# Patient Record
Sex: Male | Born: 1985 | Race: Black or African American | Hispanic: No | Marital: Married
Health system: Southern US, Community
[De-identification: ages and names within clinical notes are randomized; demographics above are authoritative.]

---

## 2011-03-27 ENCOUNTER — Emergency Department: Payer: Self-pay | Admitting: Internal Medicine

## 2011-03-27 LAB — URINALYSIS, COMPLETE
Ketone: NEGATIVE
Leukocyte Esterase: NEGATIVE
Nitrite: NEGATIVE
Protein: NEGATIVE
RBC,UR: <1 /HPF (ref 0–5)
Squamous Epithelial: <1

## 2011-03-27 LAB — COMPREHENSIVE METABOLIC PANEL
Albumin: 4.2 g/dL (ref 3.4–5.0)
Alkaline Phosphatase: 78 U/L (ref 50–136)
BUN: 6 mg/dL — ABNORMAL LOW (ref 7–18)
Bilirubin,Total: 0.4 mg/dL (ref 0.2–1.0)
Calcium, Total: 9.1 mg/dL (ref 8.5–10.1)
Creatinine: 0.88 mg/dL (ref 0.60–1.30)
EGFR (African American): >60
Glucose: 97 mg/dL (ref 65–99)
Potassium: 3.5 mmol/L (ref 3.5–5.1)
SGOT(AST): 41 U/L — ABNORMAL HIGH (ref 15–37)
SGPT (ALT): 73 U/L
Total Protein: 7.7 g/dL (ref 6.4–8.2)

## 2011-03-27 LAB — RAPID INFLUENZA A&B ANTIGENS

## 2011-03-27 LAB — CBC
MCV: 89 fL (ref 80–100)
Platelet: 230 10*3/uL (ref 150–440)
RBC: 4.97 10*6/uL (ref 4.40–5.90)
WBC: 8.4 10*3/uL (ref 3.8–10.6)

## 2011-04-07 ENCOUNTER — Ambulatory Visit: Payer: Self-pay | Admitting: Family Medicine

## 2011-04-15 ENCOUNTER — Emergency Department: Payer: Self-pay | Admitting: Emergency Medicine

## 2011-04-15 LAB — CBC WITH DIFFERENTIAL/PLATELET
Eosinophil #: 2.1 10*3/uL — ABNORMAL HIGH (ref 0.0–0.7)
HGB: 13.6 g/dL (ref 13.0–18.0)
Lymphocyte %: 15.2 %
Neutrophil %: 64.1 %
RBC: 4.57 10*6/uL (ref 4.40–5.90)
RDW: 14 % (ref 11.5–14.5)

## 2011-04-15 LAB — COMPREHENSIVE METABOLIC PANEL
Anion Gap: 9 (ref 7–16)
Co2: 26 mmol/L (ref 21–32)
Creatinine: 1.07 mg/dL (ref 0.60–1.30)
EGFR (African American): >60
Glucose: 85 mg/dL (ref 65–99)
Osmolality: 287 (ref 275–301)
Sodium: 144 mmol/L (ref 136–145)

## 2013-02-27 IMAGING — CR DG CHEST 2V
1 series · 3 of 3 positions shown · non-contrast
Comparison: none

REASON FOR EXAM: cough
COMMENTS:

[Series 1: w chest pa · 0.14mm/px · 3 of 3 slices shown]
[im 1/3]
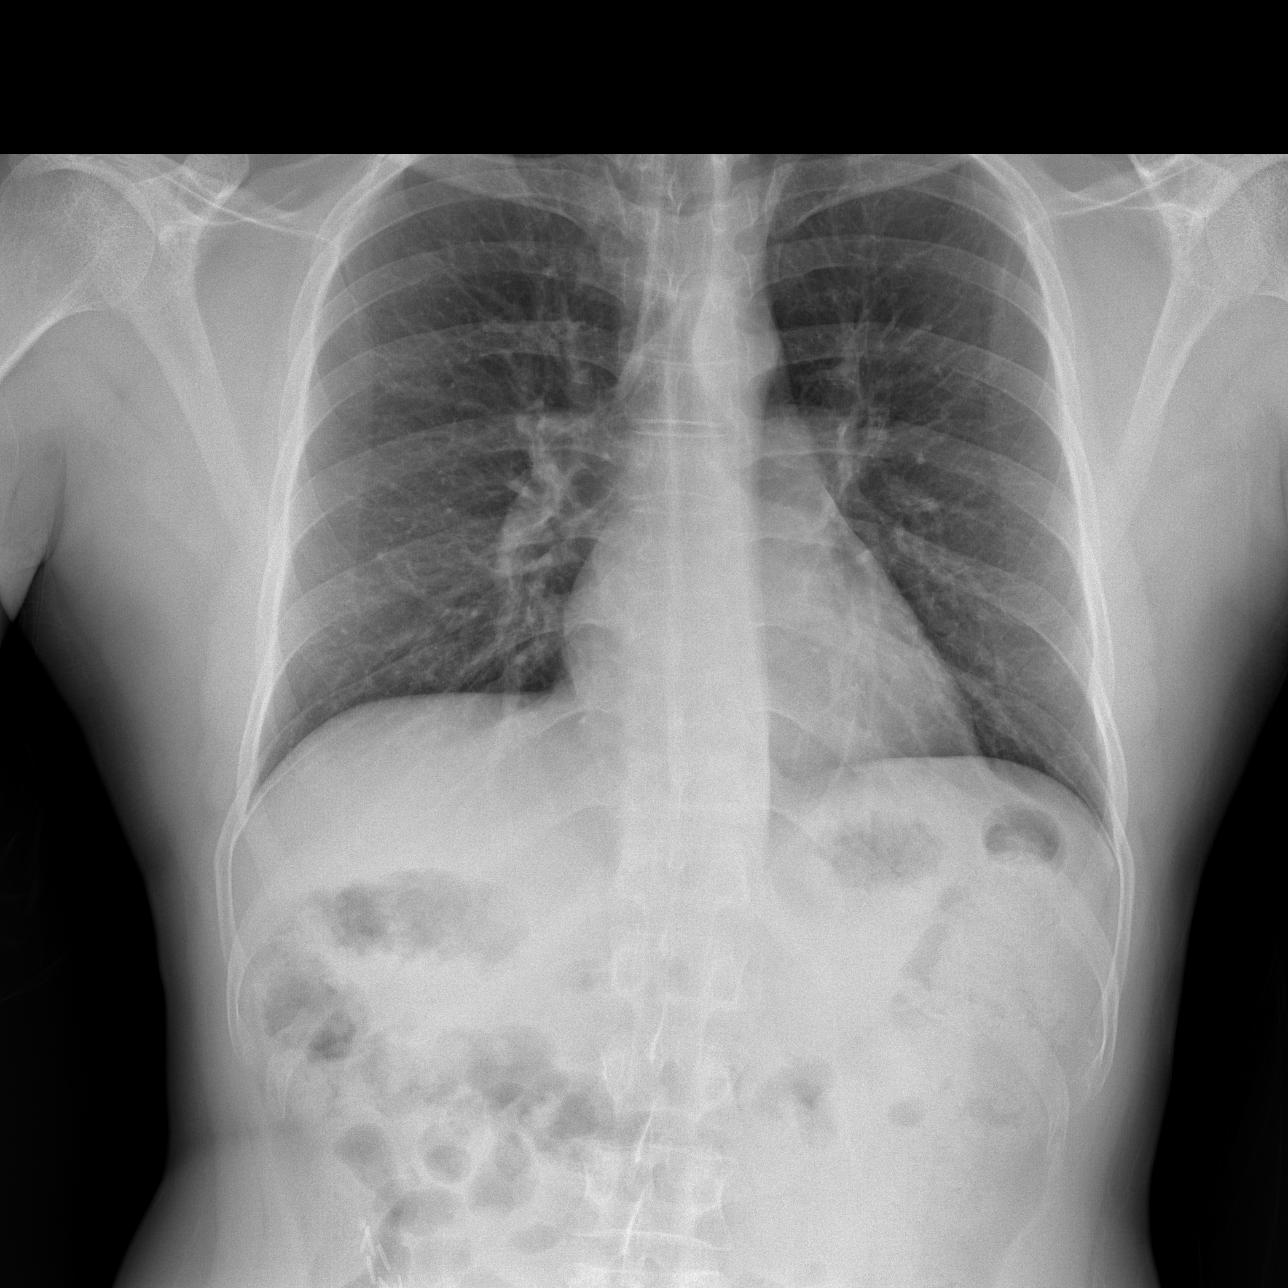
[im 2/3]
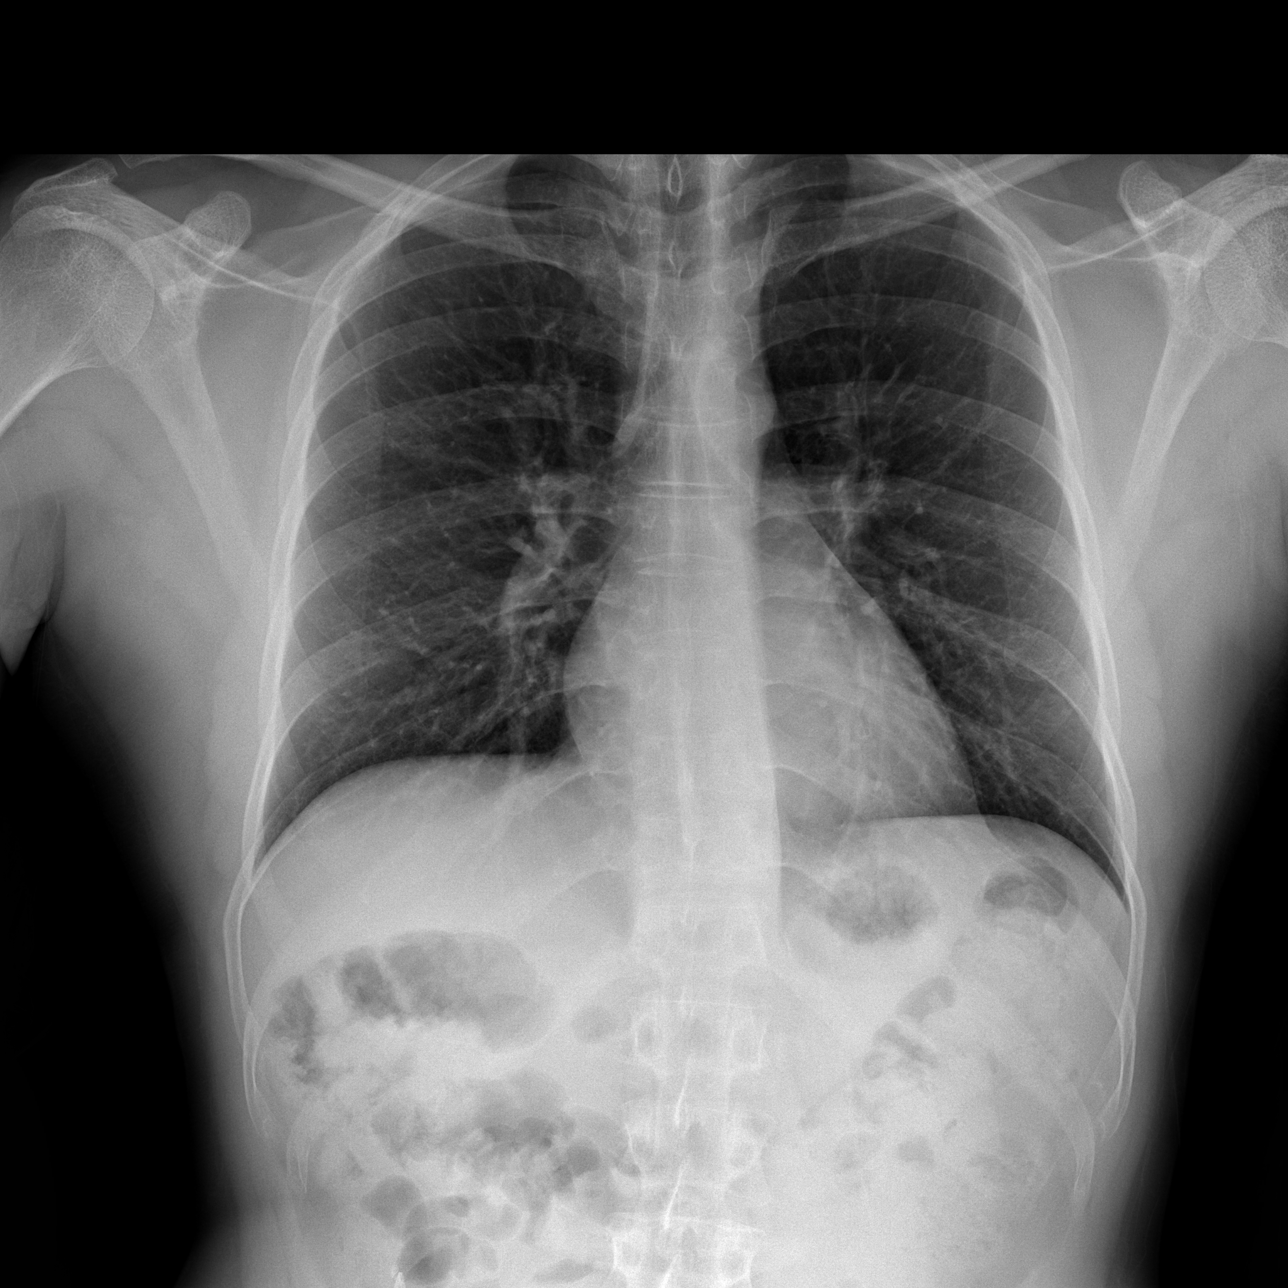
[im 3/3]
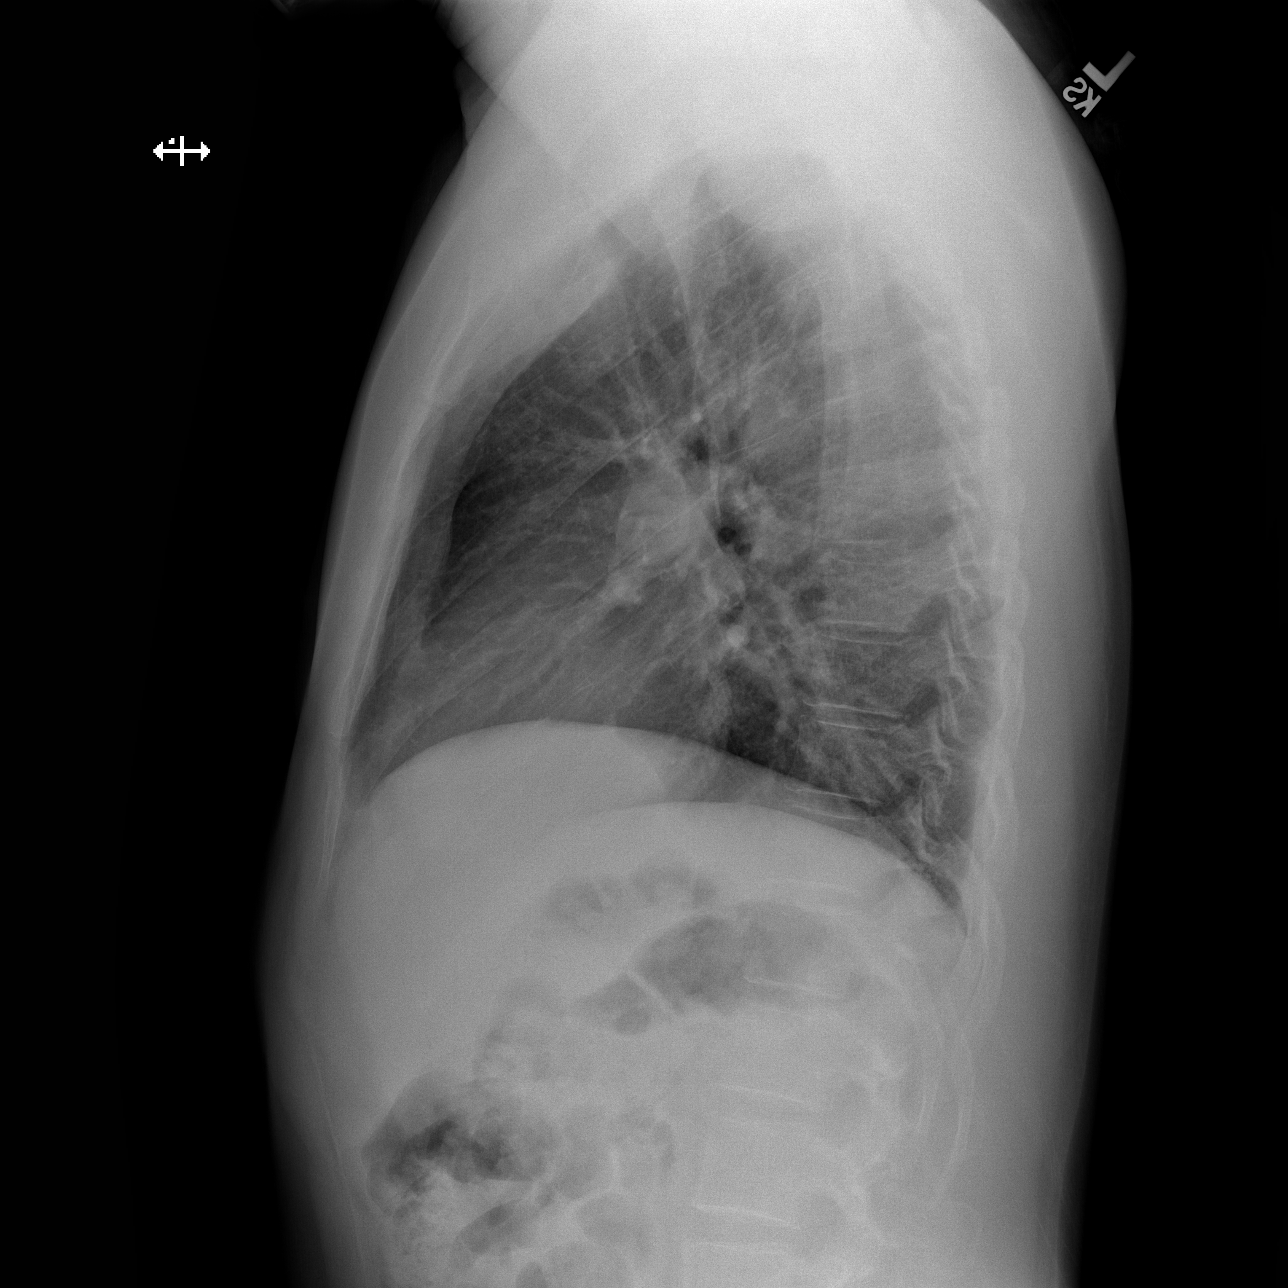

[3 of 3 positions shown; findings below may reference images not displayed]

PROCEDURE:     DXR - DXR CHEST PA (OR AP) AND LATERAL  - March 27, 2011  [DATE]

RESULT:     The lungs are adequately inflated. The perihilar lung markings
are minimally prominent. On the lateral film there are coarse lung markings
projecting over the lower thoracic spine which may reflect atelectasis.
There is no pleural effusion. The cardiac silhouette is normal in size. The
mediastinum is normal in width.
IMPRESSION: 1. I do not see definite evidence of pneumonia but I cannot exclude basilar
atelectasis posteriorly on the left.
2. Mildly increased perihilar lung markings likely reflect subsegmental
atelectasis as can be seen with acute bronchitis.

 Followup films are recommended following anticipated antibiotic therapy to
assure clearing.

## 2014-04-28 NOTE — Consult Note (Signed)
PATIENT NAME:  Ralph Matthews, Ralph Matthews MR#:  [id number] DATE OF BIRTH:  [birth year]  DATE OF CONSULTATION:  04/15/2011  REFERRING PHYSICIAN:  Dr. Manson Passey  CONSULTING PHYSICIAN:  Maricus Tanzi, MD  PRIMARY CARE PHYSICIAN: Venora Maples, Montez Hageman., MD   PRESENTING COMPLAINT: Neck pain and swelling, difficulty swallowing and sore throat.   HISTORY OF PRESENT ILLNESS: Ralph Matthews is a pleasant 29 year old gentleman with history of diagnosed eczema with id reaction approximately eight months ago at [facility], history of gastroesophageal reflux disease, who presents with report of developing neck pain and swelling last night with difficulty swallowing and sore throat and sloughing of his skin. In short, approximately eight months ago when the patient initially had his first rash flare he was seen at [facility] and diagnosed with eczema, unsure if he actually had a biopsy. It sounds like he had cultures of his lesions done but not biopsy. He reports that he was on a high-dose steroid at that time and symptoms did essentially improve. He has been stable since then. Approximately 2 to 3 weeks ago, he was diagnosed with pneumonia outpatient and was given azithromycin and prednisone. At that time, he started developing some significant dryness to his face. By 3 to 4 days ago, he started having rash in his arm and face and neck. In the past, his rash involves from the neck down and his entire body but not his face. He also reports that he was recently diagnosed bronchitis and completed a seven-day course of Augmentin yesterday. He has also been tested for allergies and had had a scratch test done as well without any significant findings except maybe allergy to milk. He denies any fevers, chills, nausea, or vomiting. He still has pain with opening his mouth and still pain in the neck area. He denies any shortness of breath. No palpitations or syncope.   PAST MEDICAL HISTORY:  1. Eczema with id reaction diagnosed eight  months ago at [facility].  2. Gastroesophageal reflux disease.  3. Recently treated for bronchitis with Augmentin, completed yesterday.  4. Pneumonia diagnosed 2 to 3 weeks ago with completion of azithromycin and prednisone.   PAST SURGICAL HISTORY:  1. Appendectomy.  2. Sphincterotomy.  3. The patient reports having had an EGD and colonoscopy to evaluate dysphagia and chronic diarrhea in the past with a history of fissures.   ALLERGIES: Ambien causes rash.   MEDICATIONS: Albuterol inhaler q.i.d. and Tessalon Perles as needed. Again, he completed a seven-day course of Augmentin on 04/14/2011.   FAMILY HISTORY: Father with hyperlipidemia, diabetes. Mother with hypertension, asthma. Sister with chronic bronchitis.   SOCIAL HISTORY: He lives in [place] with his wife and mom. He is in the [employer]. No tobacco, alcohol or drug use. He quit tobacco in 2009.   REVIEW OF SYSTEMS:  CONSTITUTIONAL: No fevers or chills. EYES: No visual disturbance. ENT: No ear pain, epistaxis, discharge. RESPIRATORY: Denies any shortness of breath currently. CARDIOVASCULAR: No chest pain, edema, palpitations, or syncope. GI: No nausea, vomiting. Diarrhea is no longer an issue. No abdominal pain, hematemesis, or melena. GU: No dysuria or hematuria. ENDOCRINE: No polyuria or polydipsia. HEMATOLOGIC: No easy bleeding. SKIN: No ulcers. MUSCULOSKELETAL: No joint pain or swelling. NEUROLOGIC: No one-sided weakness or numbness. PSYCHIATRIC: Denies any suicidal ideation.   PHYSICAL EXAMINATION:  VITAL SIGNS: Temperature 98.4, pulse 98, respiratory rate 22, blood pressure 137/86, and saturation 96% on room air.   GENERAL: Lying in bed in no apparent distress.   HEENT: Normocephalic, atraumatic. Pupils are equal  and symmetric. Nares are without discharge. Moist mucous membranes. Oropharynx without erythema, exudate or mass.   NECK: Soft with scaling and erythema. No drainage or necrotic tissue. No superimposed infection.  Tenderness on palpation. He has cervical adenopathy, left greater than right. No bleeding noted. There is extension up to his face and nose. There is no active sloughing.   CARDIOVASCULAR: Non-tachycardic. No murmurs, rubs, or gallops.   LUNGS: Clear to auscultation bilaterally. No use of accessory muscles or increased respiratory effort.   ABDOMEN: Soft. Positive bowel sounds. No mass appreciated.   EXTREMITIES: No edema. Dorsal pedis pulses intact.   MUSCULOSKELETAL: No joint effusion.   SKIN: As above. In addition, bilateral upper extremities with multiple raised papular erythematous lesions, again no drainage or superimposed infection.   PSYCHIATRIC: He is alert and oriented and cooperative.   PERTINENT LABORATORY DATA: WBC 13.5, hemoglobin 13.6, hematocrit 41.2, platelet 224, MCV 90. Glucose 85, BUN 14, creatinine 1.07, sodium 144, potassium 3.9, chloride 109, carbon dioxide 26, calcium 8.6, total bilirubin 0.2, alkaline phosphatase 81, ALT 67, AST 43.   ASSESSMENT AND PLAN: Mr. Ralph Matthews is a pleasant 29 year old gentleman with history of eczema, gastroesophageal reflux disease, recent treatment for pneumonia and bronchitis, who presents with complaints of neck swelling and pain and dysphagia with sore throat and rash.   Neck swelling and pain and rash: This is likely related to his previous issue. I am not sure if it is truly eczema as the diagnosis. It could be some sort of autoimmune process. The patient needs additional work-up including an autoimmune panel. He has been tested for allergies, as discussed above. He would benefit from re-evaluation by Dermatology. He has an appointment on April 30th, but follow up with primary physician may be able to elicit a sooner appointment or even a [facility]. For now, symptom control with hydroxyzine and Zantac as needed and also continue on high-dose steroid 60 mg daily for the next two weeks, with further determination by primary care  physician whether he needs to continue on that dose or start tapering at that time. Write for Vicodin for pain control, also for bacitracin ointment to apply daily. Wash face with soap and water daily and use sterile water. He was instructed for avoidance of abrasive items. No shaving and avoid touching face or allowing others to touch his face. Watch for development of infection, fevers, chills, nausea, vomiting, worsening erythema, drainage or pain. Given the onset of symptoms 3 to 4 days ago, unsure if there are recent triggers with the use of antibiotics. Again, the patient would likely benefit from a biopsy of his lesions. His leukocytosis is likely in the setting of his rash and stress margination, no concerns for infection. He will need a repeat CBC when he sees his primary physician.   I discussed with the patient and family at length with regards to care and followup, and they understand and are in agreement. The patient is stable for discharge from the Emergency Department.   TIME SPENT: Approximately 55 minutes spent on patient care.   ____________________________ Reuel DerbyAlounthith Tareq Dwan, MD ap:cbb D: 04/15/2011 06:32:34 ET T: 04/15/2011 11:15:22 ET JOB#: [id number]  cc: Pearlean BrownieAlounthith Shannen Flansburg, MD, <Dictator> Janeann ForehandJames H. Hawkins Jr., MD Reuel DerbyALOUNTHITH Jaylen Claude MD ELECTRONICALLY SIGNED 05/02/2011 1:55

## 2016-07-29 ENCOUNTER — Ambulatory Visit
Admission: RE | Admit: 2016-07-29 | Discharge: 2016-07-29 | Disposition: A | Discharge status: Home / Self Care | Payer: Managed Care, Other (non HMO) | Source: Ambulatory Visit | Attending: Family Medicine | Admitting: Family Medicine

## 2016-07-29 ENCOUNTER — Other Ambulatory Visit: Payer: Self-pay | Admitting: Family Medicine

## 2016-07-29 DIAGNOSIS — M545 Low back pain: Secondary | ICD-10-CM | POA: Insufficient documentation

## 2016-07-29 DIAGNOSIS — M25571 Pain in right ankle and joints of right foot: Secondary | ICD-10-CM | POA: Diagnosis present

## 2016-07-29 DIAGNOSIS — M419 Scoliosis, unspecified: Secondary | ICD-10-CM

## 2016-07-29 DIAGNOSIS — M25531 Pain in right wrist: Secondary | ICD-10-CM | POA: Diagnosis present

## 2016-07-29 DIAGNOSIS — Z9889 Other specified postprocedural states: Secondary | ICD-10-CM | POA: Diagnosis not present

## 2016-07-29 DIAGNOSIS — M19031 Primary osteoarthritis, right wrist: Secondary | ICD-10-CM | POA: Diagnosis not present

## 2016-07-29 DIAGNOSIS — R52 Pain, unspecified: Secondary | ICD-10-CM

## 2017-04-14 ENCOUNTER — Other Ambulatory Visit: Payer: Self-pay | Admitting: Orthopedic Surgery

## 2017-04-14 DIAGNOSIS — M5441 Lumbago with sciatica, right side: Secondary | ICD-10-CM

## 2017-04-14 DIAGNOSIS — M5440 Lumbago with sciatica, unspecified side: Principal | ICD-10-CM

## 2017-04-14 DIAGNOSIS — M5442 Lumbago with sciatica, left side: Secondary | ICD-10-CM

## 2017-04-14 DIAGNOSIS — M545 Low back pain, unspecified: Secondary | ICD-10-CM

## 2017-04-14 DIAGNOSIS — G8929 Other chronic pain: Secondary | ICD-10-CM

## 2017-05-03 ENCOUNTER — Ambulatory Visit
Admission: RE | Admit: 2017-05-03 | Discharge: 2017-05-03 | Disposition: A | Discharge status: Home / Self Care | Payer: Managed Care, Other (non HMO) | Source: Ambulatory Visit | Attending: Orthopedic Surgery | Admitting: Orthopedic Surgery

## 2017-05-03 DIAGNOSIS — M5442 Lumbago with sciatica, left side: Secondary | ICD-10-CM | POA: Insufficient documentation

## 2017-05-03 DIAGNOSIS — M545 Low back pain, unspecified: Secondary | ICD-10-CM

## 2017-05-03 DIAGNOSIS — G8929 Other chronic pain: Secondary | ICD-10-CM | POA: Insufficient documentation

## 2017-05-03 DIAGNOSIS — M5441 Lumbago with sciatica, right side: Secondary | ICD-10-CM | POA: Diagnosis present

## 2017-05-03 DIAGNOSIS — M5440 Lumbago with sciatica, unspecified side: Secondary | ICD-10-CM

## 2018-07-02 IMAGING — CR DG ANKLE COMPLETE 3+V*R*
1 series · 3 of 3 positions shown · non-contrast
Comparison: None.

CLINICAL DATA: Pt states no injury, pain mid anterior right ankle
for 1 year. shielded

EXAM:
RIGHT ANKLE - COMPLETE 3+ VIEW

[Series 1: dg ankle complete right · 0.14mm/px · 3 of 3 slices shown]
[im 1/3]
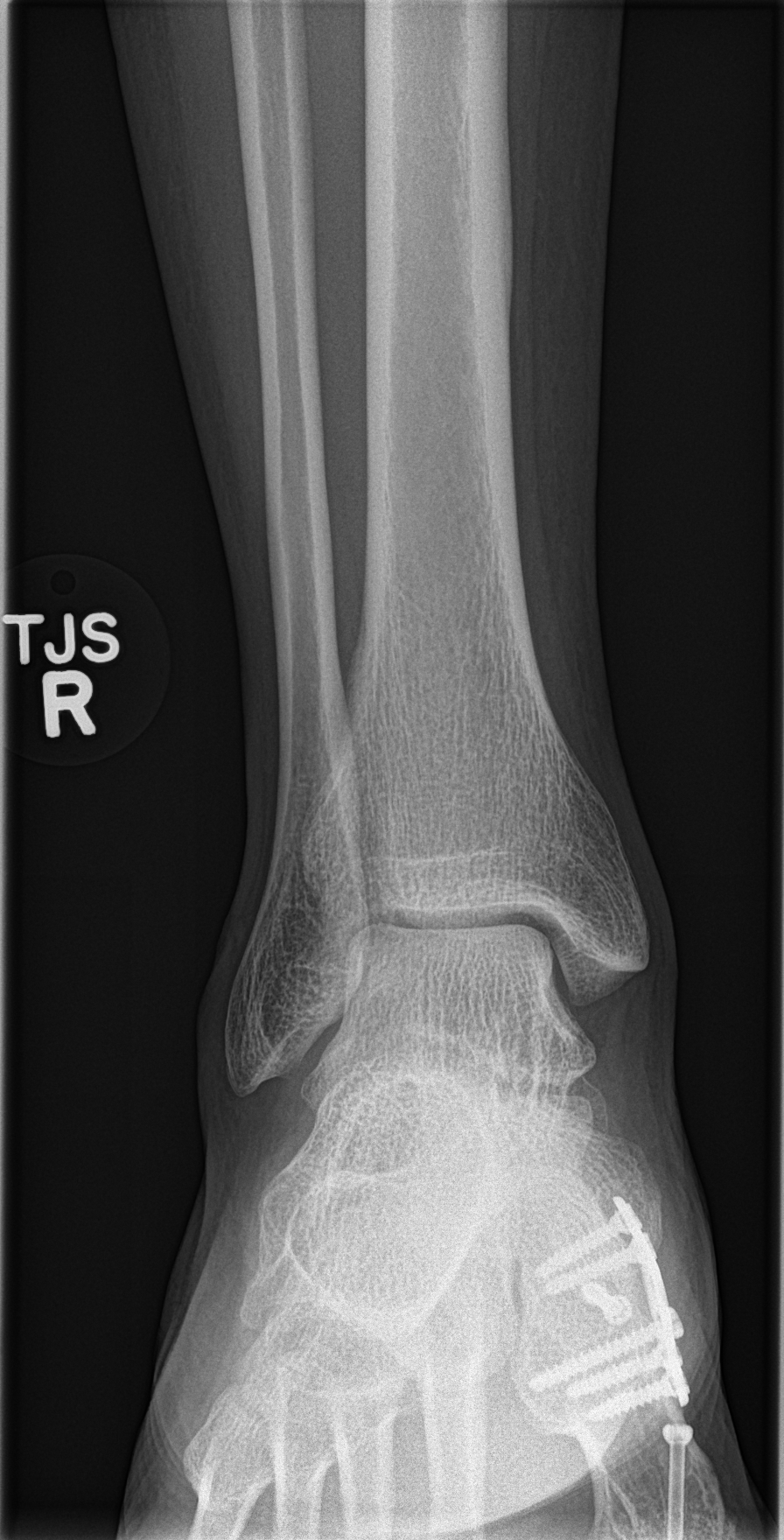
[im 2/3]
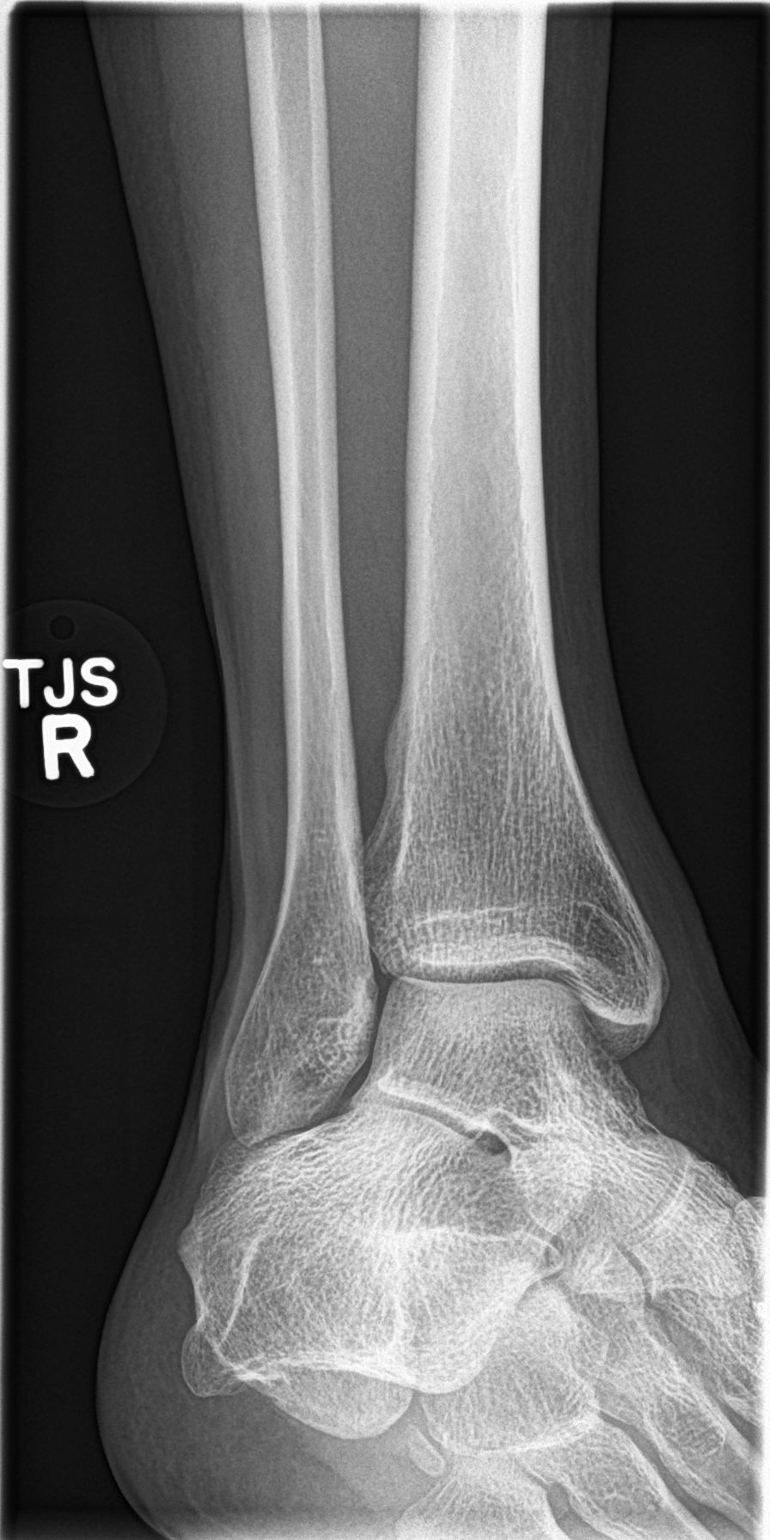
[im 3/3]
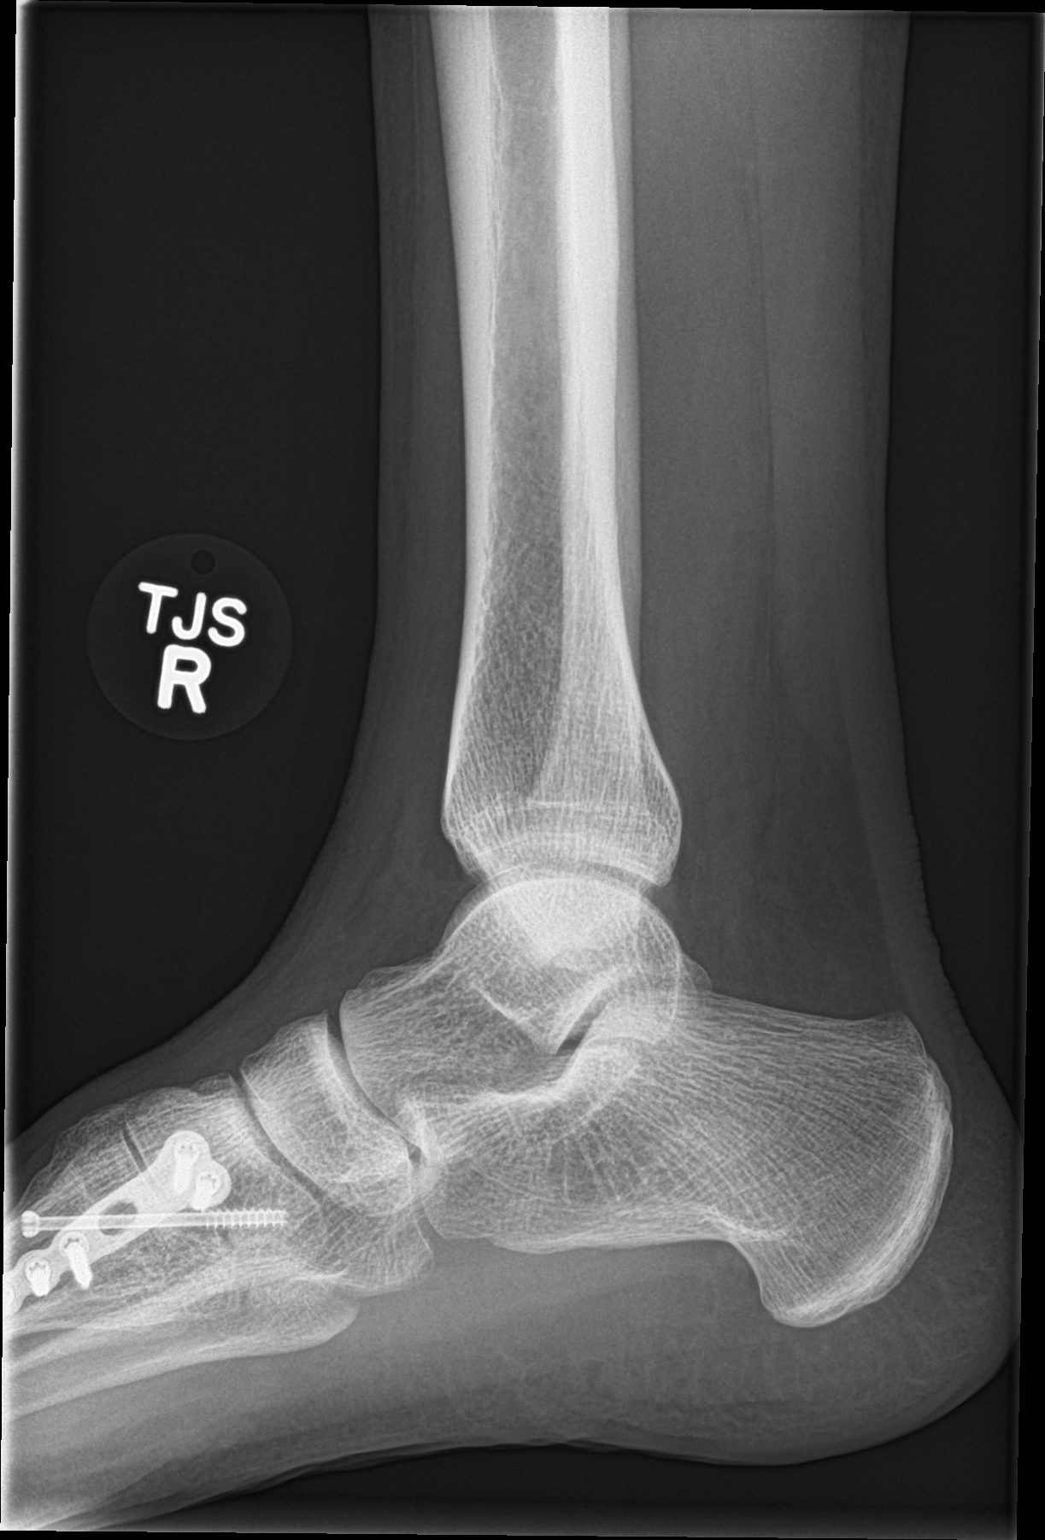

[3 of 3 positions shown; findings below may reference images not displayed]

IMPRESSION: No acute abnormality.  Postoperative changes.
FINDINGS: There is no evidence of fracture, dislocation, or joint effusion.
There is no evidence of arthropathy or other focal bone abnormality.
Soft tissues are unremarkable. Status post ORIF of the first
metatarsal.
IMPRESSION: New

## 2018-07-02 IMAGING — CR DG WRIST COMPLETE 3+V*R*
1 series · 4 of 4 positions shown · non-contrast
Comparison: None in PACs

CLINICAL DATA: Two and half months of mid wrist pain. No report of
injury.

EXAM:
RIGHT WRIST - COMPLETE 3+ VIEW

[Series 1: dg wrist complete right · 0.14mm/px · 4 of 4 slices shown]
[im 1/4]
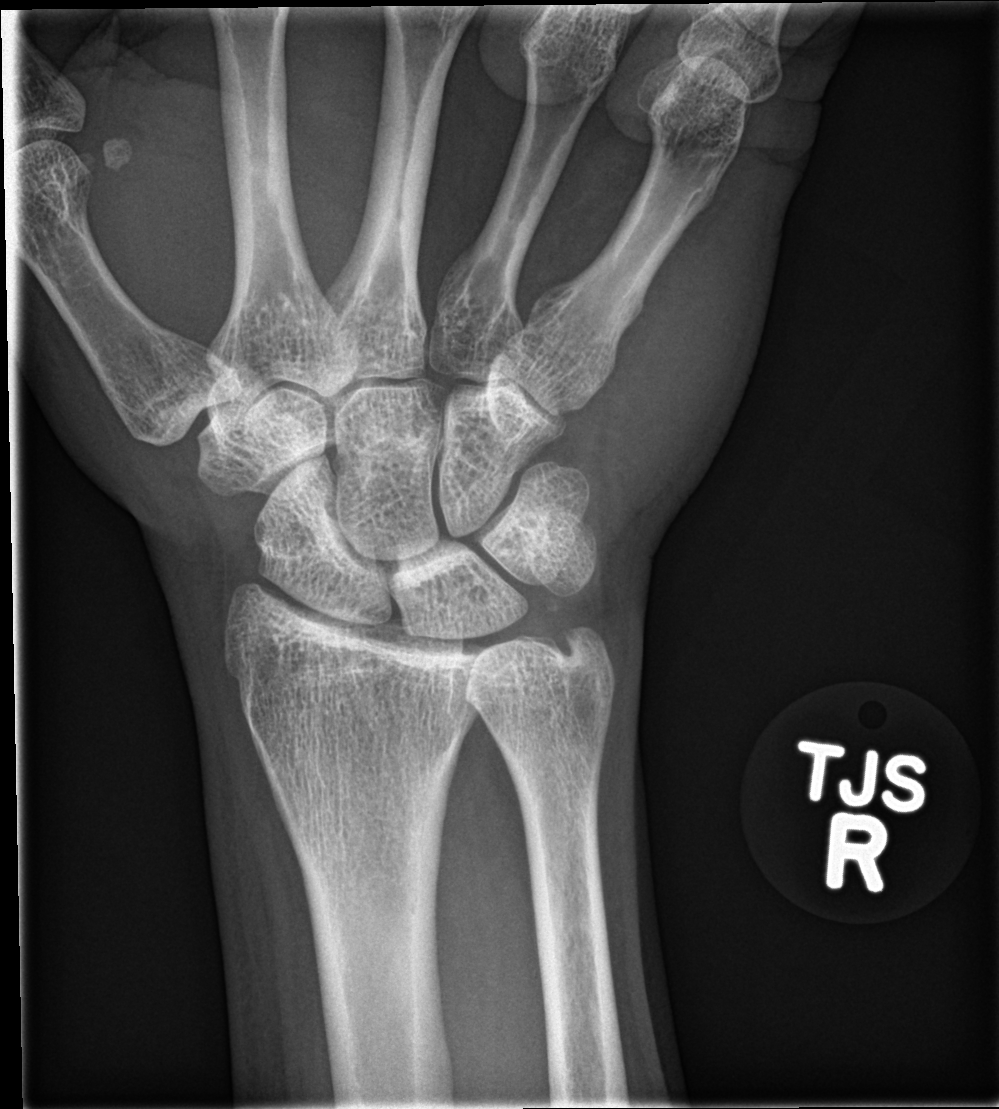
[im 2/4]
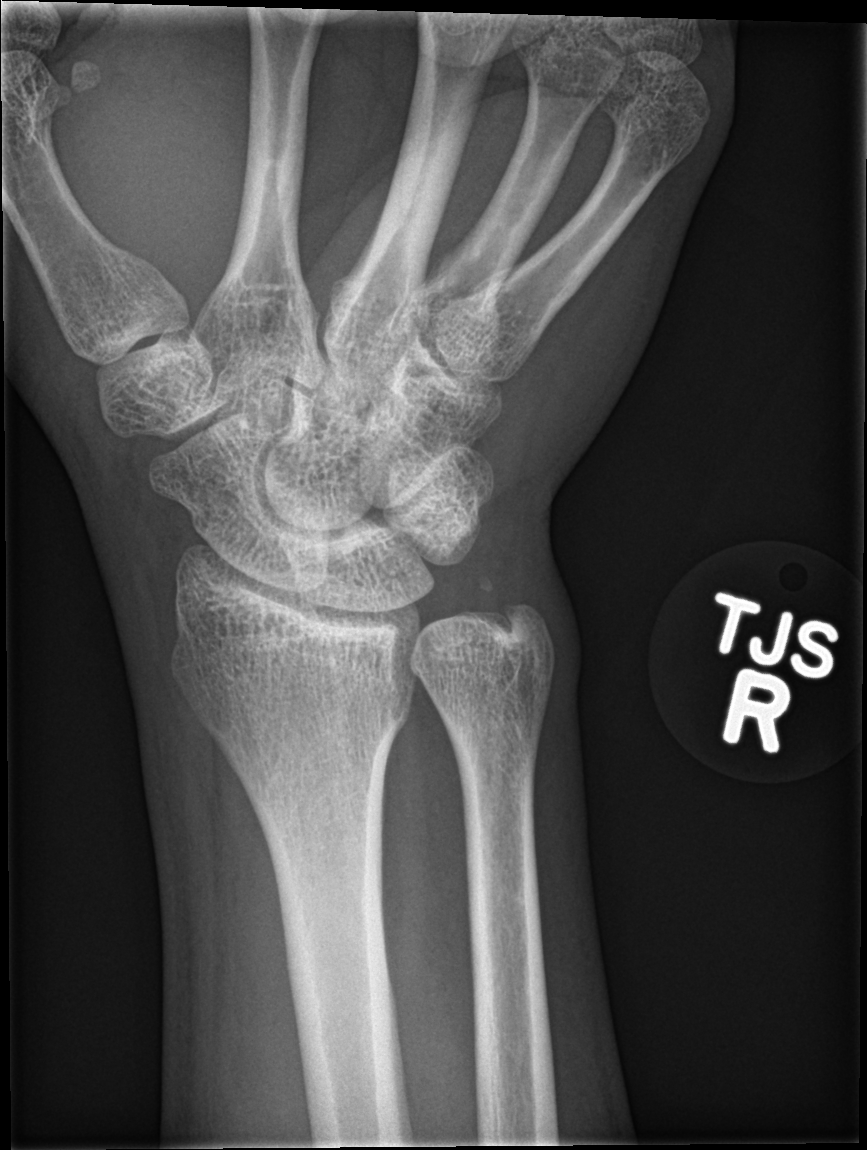
[im 3/4]
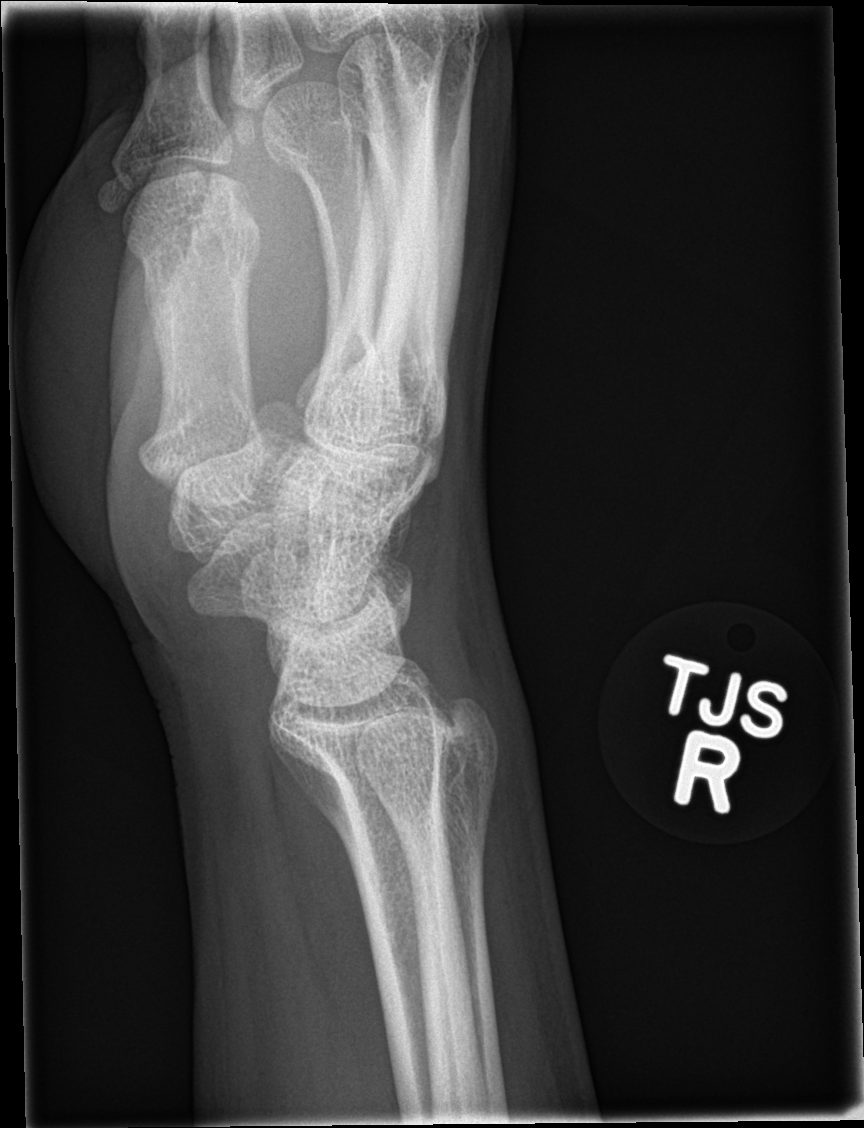
[im 4/4]
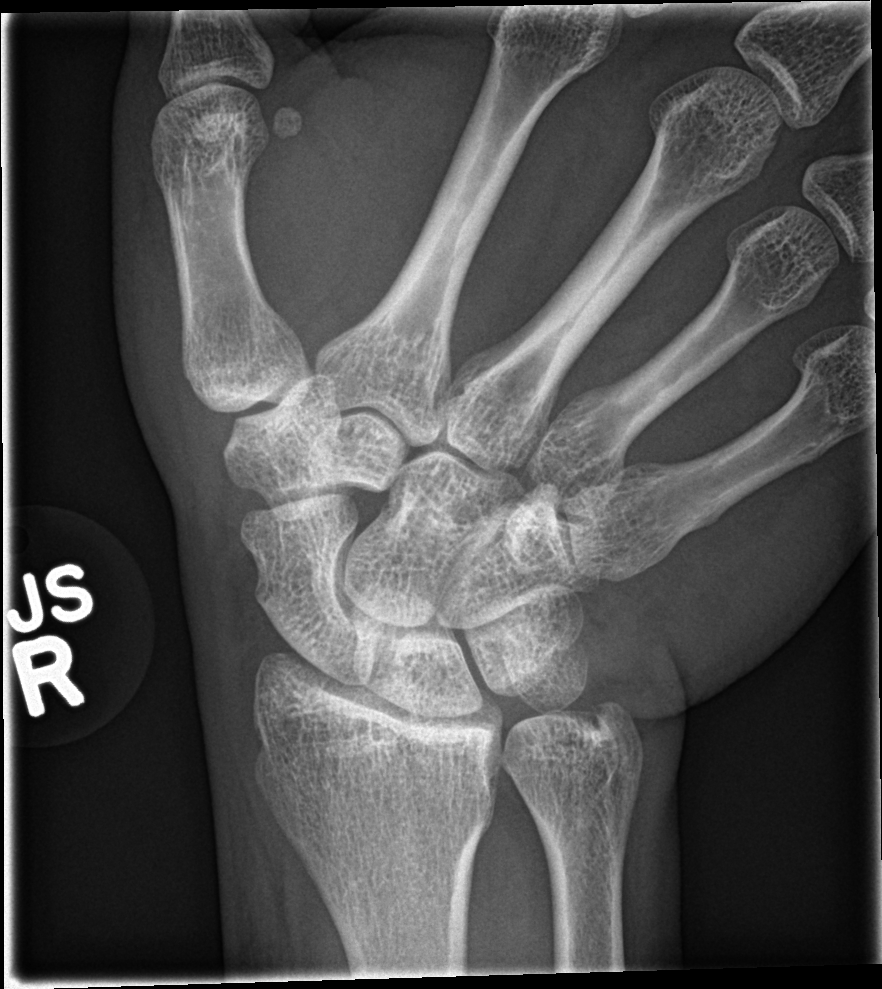

[4 of 4 positions shown; findings below may reference images not displayed]

FINDINGS: The bones are subjectively adequately mineralized. There is
calcification in the triangular fibrocartilage. The radiocarpal and
ulnocarpal joint spaces are preserved as are the intercarpal joints.
The CMC joint spaces are well maintained. There is no lytic or
blastic carpal lesion. There is no acute or healing fracture.
IMPRESSION: There is no acute bony abnormality of the right wrist. Mild
degenerative change centered on the triangular fibrocartilage is
suspected.

## 2018-07-05 ENCOUNTER — Ambulatory Visit (INDEPENDENT_AMBULATORY_CARE_PROVIDER_SITE_OTHER)
Admission: RE | Admit: 2018-07-05 | Discharge: 2018-07-05 | Disposition: A | Discharge status: Home / Self Care | Payer: Managed Care, Other (non HMO) | Source: Ambulatory Visit

## 2018-07-05 DIAGNOSIS — M5441 Lumbago with sciatica, right side: Secondary | ICD-10-CM | POA: Diagnosis not present

## 2018-07-05 DIAGNOSIS — X501XXA Overexertion from prolonged static or awkward postures, initial encounter: Secondary | ICD-10-CM

## 2018-07-05 MED ORDER — MELOXICAM 7.5 MG PO TABS: 7.5000 mg | ORAL_TABLET | Freq: Every day | ORAL | 0 refills | Status: AC

## 2018-07-05 MED ORDER — CYCLOBENZAPRINE HCL 10 MG PO TABS: 10.0000 mg | ORAL_TABLET | Freq: Every day | ORAL | 0 refills | Status: AC

## 2018-07-05 NOTE — Discharge Instructions (Addendum)
Continue conservative management of rest, ice, heat, and gentle stretches Take mobic as needed for pain relief (may cause abdominal discomfort, ulcers, and GI bleeds avoid taking with other NSAIDs) Take cyclobenzaprine at nighttime for symptomatic relief. Avoid driving or operating heavy machinery while using medication. Follow up with in person at Urgent Care or with PCP if symptoms persist Present to ER if worsening or new symptoms (fever, chills, chest pain, abdominal pain, changes in bowel or bladder habits, pain radiating into lower legs, etc...)

## 2018-07-05 NOTE — ED Provider Notes (Signed)
Virtual Visit via Video Note:  Ralph Matthews  initiated request for Telemedicine visit with [facility]gent [facility] team. I connected with Ralph Matthews  on 07/05/2018 at 10:52 AM  for a synchronized telemedicine visit using a video enabled HIPPA compliant telemedicine application. I verified that I am speaking with Ralph Matthews  using two identifiers. Lestine Box, PA-C  was physically located in a [facility] Urgent care site and Ralph Matthews was located at a different location.   The limitations of evaluation and management by telemedicine as well as the availability of in-person appointments were discussed. Patient was informed that he  may incur a bill ( including co-pay) for this virtual visit encounter. Ralph Matthews  expressed understanding and gave verbal consent to proceed with virtual visit.  History of Present Illness:Ralph Matthews  is a 33 y.o. male presents with  Patient also complains of low back pain that occurred this morning after picking up some heavy boxes at work.  Felt a pop.  Localizes pain from tailbone that radiates into right foot.  Has not tried OTC medications.   Worse with laying flat.  Denies similar symptoms in the past.  Denies fever, chills, nausea, vomiting, saddle paresthesias, loss of bowel or bladder function.    History reviewed. No pertinent past medical history.  Allergies  Allergen Reactions  . Ambien [Zolpidem Tartrate] Rash     Observations/Objective: GENERAL APPEARANCE: Well developed, well nourished, alert and cooperative, and appears to be in no acute distress. HEAD: normocephalic. Non labored breathing, no dyspnea or distress Skin: Skin normal color  PSYCHIATRIC: The mental examination revealed the patient was oriented to person, place, and time. The patient was able to demonstrate good judgement and reason, without hallucinations, abnormal affect or abnormal behaviors during the examination.   Assessment and Plan:  Low  back pain  Continue conservative management of rest, ice, heat, and gentle stretches Take mobic as needed for pain relief (may cause abdominal discomfort, ulcers, and GI bleeds avoid taking with other NSAIDs) Take cyclobenzaprine at nighttime for symptomatic relief. Avoid driving or operating heavy machinery while using medication. Follow up with in person at Urgent Care or with PCP if symptoms persist Present to ER if worsening or new symptoms (fever, chills, chest pain, abdominal pain, changes in bowel or bladder habits, pain radiating into lower legs, etc...)   Follow Up Instructions:  I discussed the assessment and treatment plan with the patient. The patient was provided an opportunity to ask questions and all were answered. The patient agreed with the plan and demonstrated an understanding of the instructions.   The patient was advised to call back or seek an in-person evaluation if the symptoms worsen or if the condition fails to improve as anticipated.  I provided 15 minutes of non-face-to-face time during this encounter.   Lestine Box, PA-C  07/05/2018 10:52 AM          Lestine Box, PA-C 07/05/18 [id number]

## 2019-04-06 IMAGING — MR MR LUMBAR SPINE W/O CM
4 of 5 series · 26 of 48 positions shown · non-contrast
Comparison: Lumbar radiographs 07/29/2016.

CLINICAL DATA: 31-year-old male with lumbar back pain, bilateral
leg pain and numbness. Symptoms exacerbated by walking and standing.
No known injury.

EXAM:
MRI LUMBAR SPINE WITHOUT CONTRAST
TECHNIQUE: Multiplanar, multisequence MR imaging of the lumbar spine was
performed. No intravenous contrast was administered.

[Series 2: T2 · sagittal · 4.0mm · 0.81mm/px · 6 of 15 slices shown (1 of 2)]
[im 1/15]
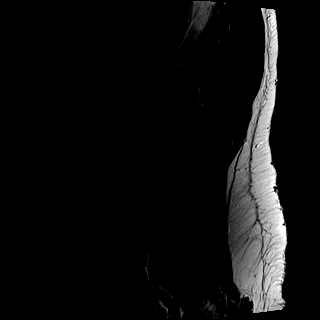
[im 3/15]
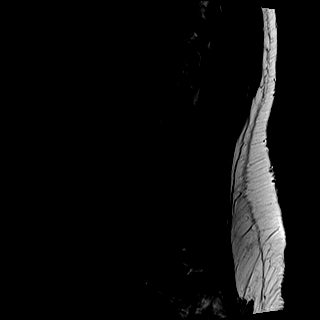
[im 6/15]
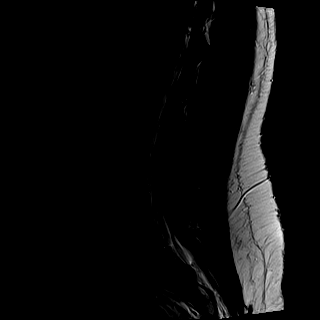
[im 9/15]
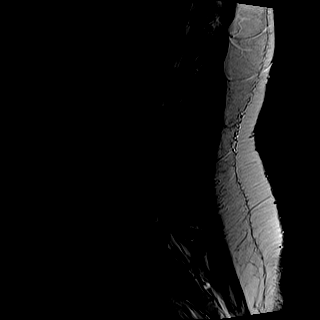
[im 12/15]
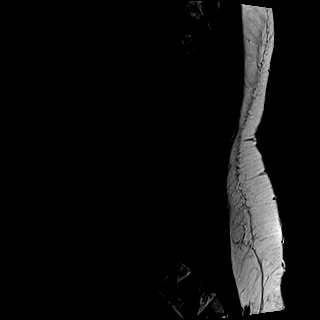
[im 15/15]
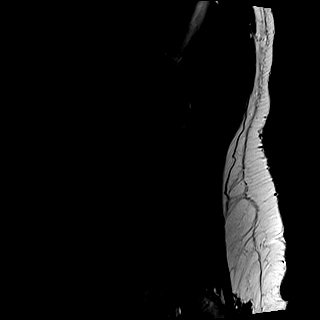

[Series 3: T1 · sagittal · 4.0mm · 0.41mm/px · 6 of 15 slices shown (1 of 2)]
[im 1/15]
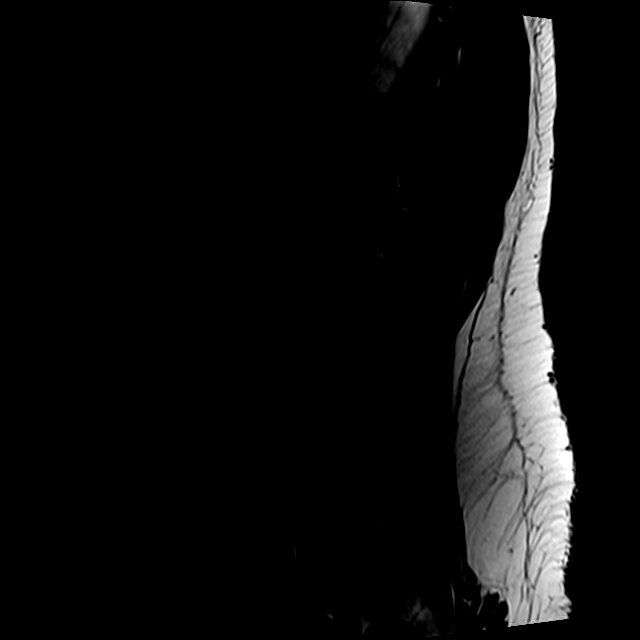
[im 3/15]
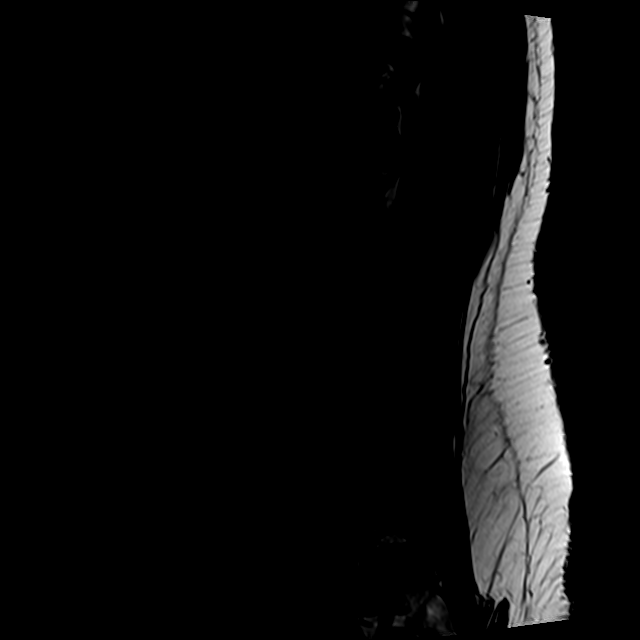
[im 6/15]
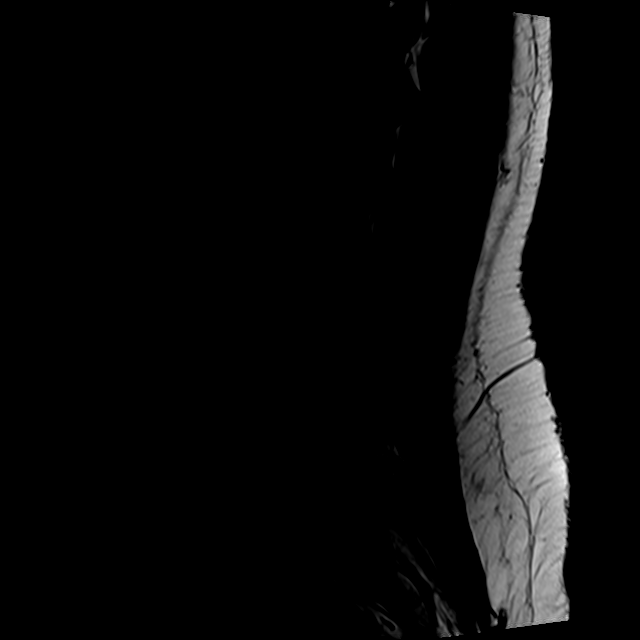
[im 9/15]
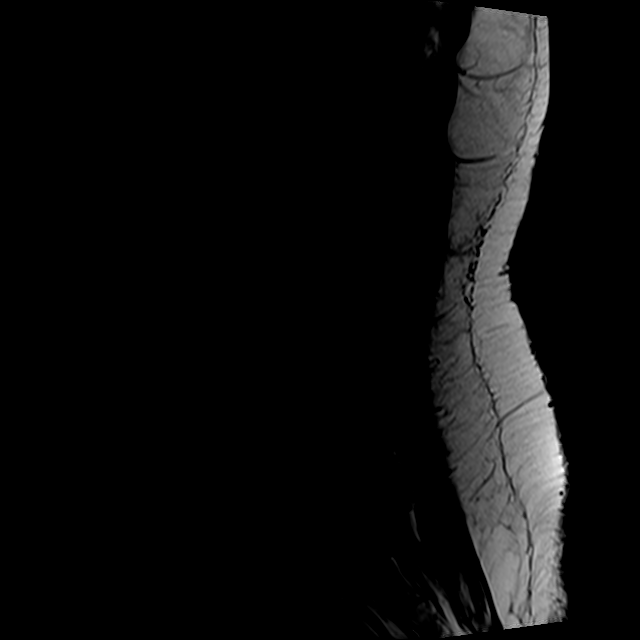
[im 12/15]
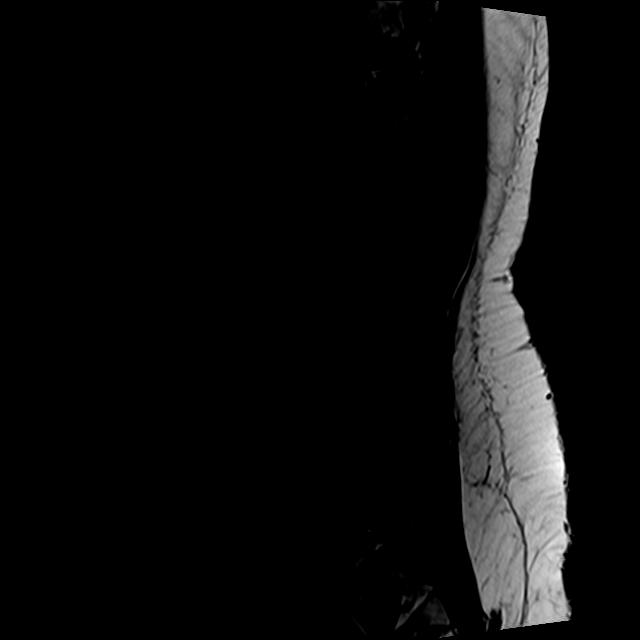
[im 15/15]
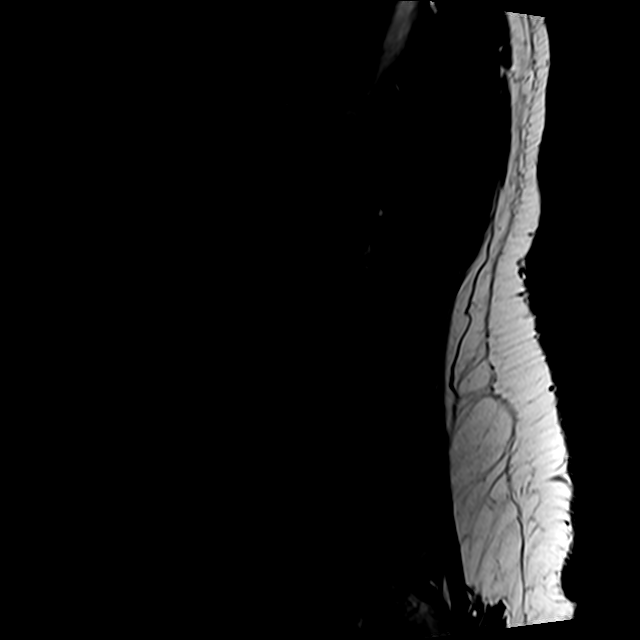

[Series 5: T2 · axial · 4.0mm · 0.78mm/px · z∈[-31,+186]mm · 9 of 41 slices shown (2 of 2)]
[im 1/41]
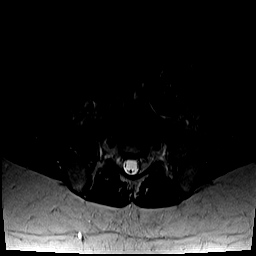
[im 6/41]
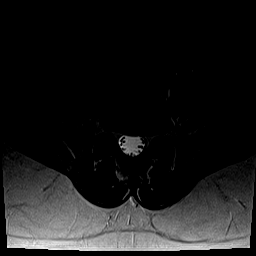
[im 12/41]
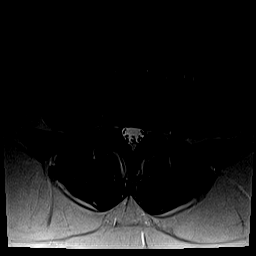
[im 18/41]
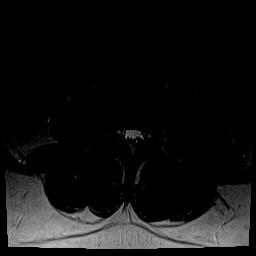
[im 21/41]
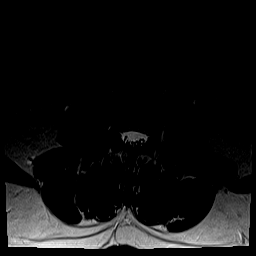
[im 23/41]
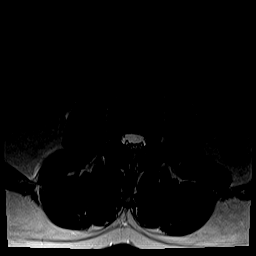
[im 29/41]
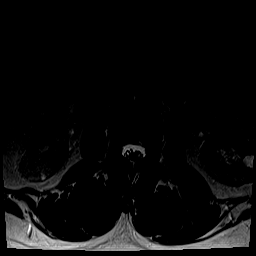
[im 35/41]
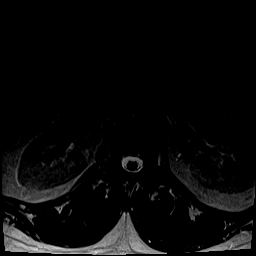
[im 41/41]
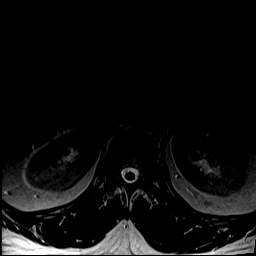

[Series 6: T1 · axial · 4.0mm · 0.39mm/px · z∈[-31,+156]mm · 5 of 41 slices shown (2 of 2)]
[im 1/41]
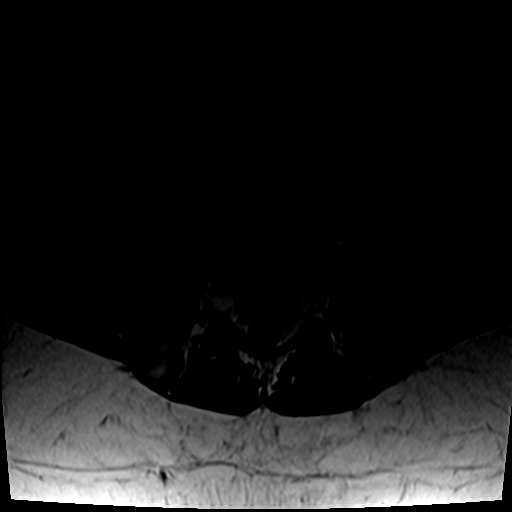
[im 6/41]
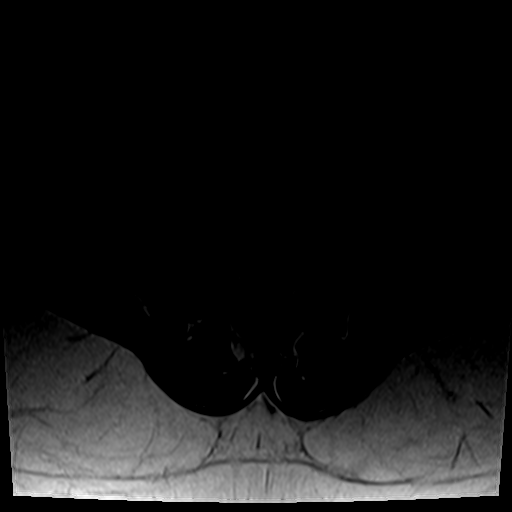
[im 12/41]
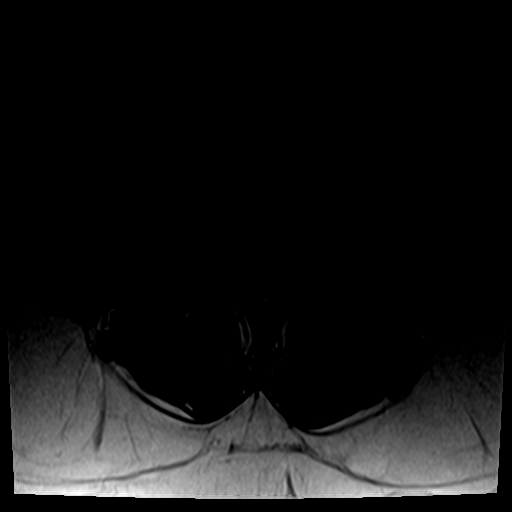
[im 21/41]
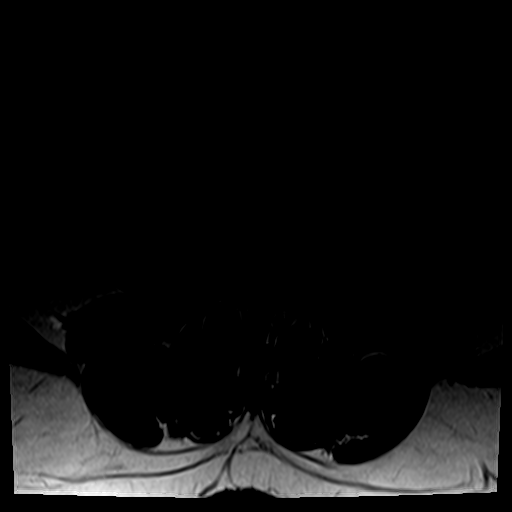
[im 35/41]
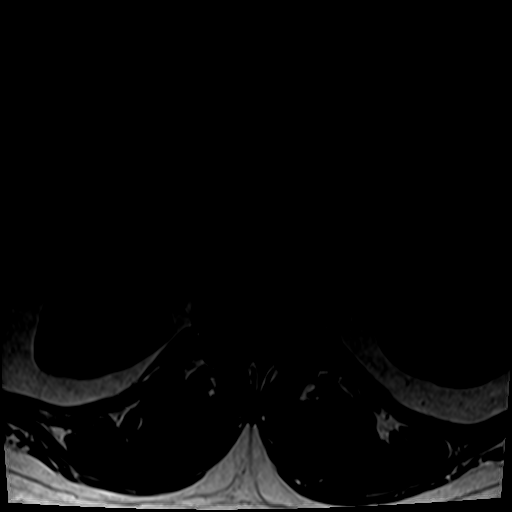

[26 of 48 positions shown; findings below may reference images not displayed]

FINDINGS: Segmentation: Normal lumbar segmentation depicted on the comparison.

Alignment:  Normal lumbar lordosis.

Vertebrae: Normal bone marrow signal. No marrow edema or evidence of
acute osseous abnormality. Intact visible sacrum and SI joints.

Conus medullaris and cauda equina: Conus extends to the L2 level.
Visible lower thoracic spinal cord, conus and cauda equina appear
normal. Incidental fatty filum terminalis (normal variant series 6,
image 39).

Paraspinal and other soft tissues: Occasional small benign renal
cysts. Otherwise negative visualized abdominal viscera and
paraspinal soft tissues.

Disc levels:

T11-T12: Partially visible, negative.

T12-L1:  Mild facet hypertrophy.

L1-L2:  Negative.

L2-L3:  Negative.

L3-L4:  Negative.

L4-L5: Minimal posterior disc bulging. Mild facet hypertrophy. No
stenosis.

L5-S1: Minimal posterior disc bulging. Mild facet hypertrophy. No
stenosis.
IMPRESSION: Negative lumbar MRI; no acute osseous abnormality and minimal/age
congruent lumbar spine degeneration with no disc herniation or
neural impingement.

## 2020-01-12 ENCOUNTER — Other Ambulatory Visit: Payer: Managed Care, Other (non HMO)

## 2020-01-19 ENCOUNTER — Other Ambulatory Visit: Payer: Self-pay

## 2020-01-19 DIAGNOSIS — Z20822 Contact with and (suspected) exposure to covid-19: Secondary | ICD-10-CM

## 2020-01-22 LAB — NOVEL CORONAVIRUS, NAA: SARS-CoV-2, NAA: DETECTED — AB

## 2020-02-06 ENCOUNTER — Other Ambulatory Visit: Payer: 59

## 2020-02-06 DIAGNOSIS — Z20822 Contact with and (suspected) exposure to covid-19: Secondary | ICD-10-CM

## 2020-02-07 LAB — NOVEL CORONAVIRUS, NAA: SARS-CoV-2, NAA: NOT DETECTED

## 2020-02-07 LAB — SARS-COV-2, NAA 2 DAY TAT

## 2021-01-06 DIAGNOSIS — M5441 Lumbago with sciatica, right side: Secondary | ICD-10-CM | POA: Diagnosis not present

## 2021-01-06 DIAGNOSIS — J309 Allergic rhinitis, unspecified: Secondary | ICD-10-CM | POA: Diagnosis not present

## 2021-01-06 DIAGNOSIS — L270 Generalized skin eruption due to drugs and medicaments taken internally: Secondary | ICD-10-CM | POA: Diagnosis not present

## 2021-01-06 DIAGNOSIS — M79604 Pain in right leg: Secondary | ICD-10-CM | POA: Diagnosis not present

## 2021-01-10 DIAGNOSIS — G4733 Obstructive sleep apnea (adult) (pediatric): Secondary | ICD-10-CM | POA: Diagnosis not present

## 2021-01-21 DIAGNOSIS — Z03818 Encounter for observation for suspected exposure to other biological agents ruled out: Secondary | ICD-10-CM | POA: Diagnosis not present

## 2021-01-21 DIAGNOSIS — H66002 Acute suppurative otitis media without spontaneous rupture of ear drum, left ear: Secondary | ICD-10-CM | POA: Diagnosis not present

## 2021-01-21 DIAGNOSIS — M542 Cervicalgia: Secondary | ICD-10-CM | POA: Diagnosis not present

## 2021-01-21 DIAGNOSIS — J019 Acute sinusitis, unspecified: Secondary | ICD-10-CM | POA: Diagnosis not present

## 2021-02-10 DIAGNOSIS — R69 Illness, unspecified: Secondary | ICD-10-CM | POA: Diagnosis not present

## 2021-02-10 DIAGNOSIS — E237 Disorder of pituitary gland, unspecified: Secondary | ICD-10-CM | POA: Diagnosis not present

## 2021-02-10 DIAGNOSIS — G4733 Obstructive sleep apnea (adult) (pediatric): Secondary | ICD-10-CM | POA: Diagnosis not present

## 2021-02-10 DIAGNOSIS — G43719 Chronic migraine without aura, intractable, without status migrainosus: Secondary | ICD-10-CM | POA: Diagnosis not present

## 2021-02-10 DIAGNOSIS — M5412 Radiculopathy, cervical region: Secondary | ICD-10-CM | POA: Diagnosis not present

## 2021-02-12 DIAGNOSIS — M5416 Radiculopathy, lumbar region: Secondary | ICD-10-CM | POA: Diagnosis not present

## 2021-02-12 DIAGNOSIS — G894 Chronic pain syndrome: Secondary | ICD-10-CM | POA: Diagnosis not present

## 2021-02-20 DIAGNOSIS — M5441 Lumbago with sciatica, right side: Secondary | ICD-10-CM | POA: Diagnosis not present

## 2021-02-20 DIAGNOSIS — M961 Postlaminectomy syndrome, not elsewhere classified: Secondary | ICD-10-CM | POA: Diagnosis not present

## 2021-02-20 DIAGNOSIS — M5416 Radiculopathy, lumbar region: Secondary | ICD-10-CM | POA: Diagnosis not present

## 2021-02-20 DIAGNOSIS — M79604 Pain in right leg: Secondary | ICD-10-CM | POA: Diagnosis not present

## 2021-02-20 DIAGNOSIS — G894 Chronic pain syndrome: Secondary | ICD-10-CM | POA: Diagnosis not present

## 2021-02-20 DIAGNOSIS — G8929 Other chronic pain: Secondary | ICD-10-CM | POA: Diagnosis not present

## 2021-02-27 DIAGNOSIS — Z9689 Presence of other specified functional implants: Secondary | ICD-10-CM | POA: Diagnosis not present

## 2021-02-27 DIAGNOSIS — G894 Chronic pain syndrome: Secondary | ICD-10-CM | POA: Diagnosis not present

## 2021-02-27 DIAGNOSIS — M5416 Radiculopathy, lumbar region: Secondary | ICD-10-CM | POA: Diagnosis not present

## 2021-03-10 DIAGNOSIS — G4733 Obstructive sleep apnea (adult) (pediatric): Secondary | ICD-10-CM | POA: Diagnosis not present

## 2021-03-27 DIAGNOSIS — M5442 Lumbago with sciatica, left side: Secondary | ICD-10-CM | POA: Diagnosis not present

## 2021-03-27 DIAGNOSIS — M5441 Lumbago with sciatica, right side: Secondary | ICD-10-CM | POA: Diagnosis not present

## 2021-03-27 DIAGNOSIS — G8929 Other chronic pain: Secondary | ICD-10-CM | POA: Diagnosis not present

## 2021-04-07 DIAGNOSIS — M961 Postlaminectomy syndrome, not elsewhere classified: Secondary | ICD-10-CM | POA: Diagnosis not present

## 2021-04-10 DIAGNOSIS — G4733 Obstructive sleep apnea (adult) (pediatric): Secondary | ICD-10-CM | POA: Diagnosis not present

## 2021-04-17 DIAGNOSIS — M5442 Lumbago with sciatica, left side: Secondary | ICD-10-CM | POA: Diagnosis not present

## 2021-04-17 DIAGNOSIS — G894 Chronic pain syndrome: Secondary | ICD-10-CM | POA: Diagnosis not present

## 2021-04-17 DIAGNOSIS — G8929 Other chronic pain: Secondary | ICD-10-CM | POA: Diagnosis not present

## 2021-04-17 DIAGNOSIS — M5412 Radiculopathy, cervical region: Secondary | ICD-10-CM | POA: Diagnosis not present

## 2021-04-17 DIAGNOSIS — M542 Cervicalgia: Secondary | ICD-10-CM | POA: Diagnosis not present

## 2021-04-17 DIAGNOSIS — M5416 Radiculopathy, lumbar region: Secondary | ICD-10-CM | POA: Diagnosis not present

## 2021-04-17 DIAGNOSIS — M5441 Lumbago with sciatica, right side: Secondary | ICD-10-CM | POA: Diagnosis not present

## 2021-04-17 DIAGNOSIS — Z9689 Presence of other specified functional implants: Secondary | ICD-10-CM | POA: Diagnosis not present

## 2021-04-21 DIAGNOSIS — Z79899 Other long term (current) drug therapy: Secondary | ICD-10-CM | POA: Diagnosis not present

## 2021-04-21 DIAGNOSIS — L235 Allergic contact dermatitis due to other chemical products: Secondary | ICD-10-CM | POA: Diagnosis not present

## 2021-04-21 DIAGNOSIS — L739 Follicular disorder, unspecified: Secondary | ICD-10-CM | POA: Diagnosis not present

## 2021-04-21 DIAGNOSIS — L2084 Intrinsic (allergic) eczema: Secondary | ICD-10-CM | POA: Diagnosis not present

## 2021-05-07 DIAGNOSIS — M79642 Pain in left hand: Secondary | ICD-10-CM | POA: Diagnosis not present

## 2021-05-10 DIAGNOSIS — G4733 Obstructive sleep apnea (adult) (pediatric): Secondary | ICD-10-CM | POA: Diagnosis not present

## 2021-05-19 DIAGNOSIS — G4733 Obstructive sleep apnea (adult) (pediatric): Secondary | ICD-10-CM | POA: Diagnosis not present

## 2021-05-21 DIAGNOSIS — M5412 Radiculopathy, cervical region: Secondary | ICD-10-CM | POA: Diagnosis not present

## 2021-05-21 DIAGNOSIS — G894 Chronic pain syndrome: Secondary | ICD-10-CM | POA: Diagnosis not present

## 2021-06-10 DIAGNOSIS — G4733 Obstructive sleep apnea (adult) (pediatric): Secondary | ICD-10-CM | POA: Diagnosis not present

## 2021-06-10 DIAGNOSIS — J209 Acute bronchitis, unspecified: Secondary | ICD-10-CM | POA: Diagnosis not present

## 2021-06-10 DIAGNOSIS — B9689 Other specified bacterial agents as the cause of diseases classified elsewhere: Secondary | ICD-10-CM | POA: Diagnosis not present

## 2021-06-10 DIAGNOSIS — Z03818 Encounter for observation for suspected exposure to other biological agents ruled out: Secondary | ICD-10-CM | POA: Diagnosis not present

## 2021-06-10 DIAGNOSIS — J019 Acute sinusitis, unspecified: Secondary | ICD-10-CM | POA: Diagnosis not present

## 2021-06-19 DIAGNOSIS — Z03818 Encounter for observation for suspected exposure to other biological agents ruled out: Secondary | ICD-10-CM | POA: Diagnosis not present

## 2021-06-19 DIAGNOSIS — J019 Acute sinusitis, unspecified: Secondary | ICD-10-CM | POA: Diagnosis not present

## 2021-06-19 DIAGNOSIS — R051 Acute cough: Secondary | ICD-10-CM | POA: Diagnosis not present

## 2021-06-19 DIAGNOSIS — B9689 Other specified bacterial agents as the cause of diseases classified elsewhere: Secondary | ICD-10-CM | POA: Diagnosis not present

## 2021-06-19 DIAGNOSIS — J209 Acute bronchitis, unspecified: Secondary | ICD-10-CM | POA: Diagnosis not present

## 2021-07-10 DIAGNOSIS — G4733 Obstructive sleep apnea (adult) (pediatric): Secondary | ICD-10-CM | POA: Diagnosis not present

## 2021-08-04 DIAGNOSIS — G894 Chronic pain syndrome: Secondary | ICD-10-CM | POA: Diagnosis not present

## 2021-08-04 DIAGNOSIS — M542 Cervicalgia: Secondary | ICD-10-CM | POA: Diagnosis not present

## 2021-08-04 DIAGNOSIS — M5412 Radiculopathy, cervical region: Secondary | ICD-10-CM | POA: Diagnosis not present

## 2021-08-10 DIAGNOSIS — G4733 Obstructive sleep apnea (adult) (pediatric): Secondary | ICD-10-CM | POA: Diagnosis not present

## 2021-08-19 DIAGNOSIS — G4733 Obstructive sleep apnea (adult) (pediatric): Secondary | ICD-10-CM | POA: Diagnosis not present

## 2021-09-10 DIAGNOSIS — G4733 Obstructive sleep apnea (adult) (pediatric): Secondary | ICD-10-CM | POA: Diagnosis not present

## 2021-09-18 DIAGNOSIS — J029 Acute pharyngitis, unspecified: Secondary | ICD-10-CM | POA: Diagnosis not present

## 2021-10-08 DIAGNOSIS — Z833 Family history of diabetes mellitus: Secondary | ICD-10-CM | POA: Diagnosis not present

## 2021-10-08 DIAGNOSIS — I10 Essential (primary) hypertension: Secondary | ICD-10-CM | POA: Diagnosis not present

## 2021-10-08 DIAGNOSIS — Z87891 Personal history of nicotine dependence: Secondary | ICD-10-CM | POA: Diagnosis not present

## 2021-10-08 DIAGNOSIS — Z6831 Body mass index (BMI) 31.0-31.9, adult: Secondary | ICD-10-CM | POA: Diagnosis not present

## 2021-10-08 DIAGNOSIS — G43909 Migraine, unspecified, not intractable, without status migrainosus: Secondary | ICD-10-CM | POA: Diagnosis not present

## 2021-10-08 DIAGNOSIS — R450 Nervousness: Secondary | ICD-10-CM | POA: Diagnosis not present

## 2021-10-08 DIAGNOSIS — G4733 Obstructive sleep apnea (adult) (pediatric): Secondary | ICD-10-CM | POA: Diagnosis not present

## 2021-10-08 DIAGNOSIS — L209 Atopic dermatitis, unspecified: Secondary | ICD-10-CM | POA: Diagnosis not present

## 2021-10-08 DIAGNOSIS — G629 Polyneuropathy, unspecified: Secondary | ICD-10-CM | POA: Diagnosis not present

## 2021-10-08 DIAGNOSIS — J45909 Unspecified asthma, uncomplicated: Secondary | ICD-10-CM | POA: Diagnosis not present

## 2021-10-08 DIAGNOSIS — Z8249 Family history of ischemic heart disease and other diseases of the circulatory system: Secondary | ICD-10-CM | POA: Diagnosis not present

## 2021-10-08 DIAGNOSIS — E669 Obesity, unspecified: Secondary | ICD-10-CM | POA: Diagnosis not present

## 2021-10-10 DIAGNOSIS — G4733 Obstructive sleep apnea (adult) (pediatric): Secondary | ICD-10-CM | POA: Diagnosis not present

## 2021-11-09 DIAGNOSIS — M79604 Pain in right leg: Secondary | ICD-10-CM | POA: Diagnosis not present

## 2021-11-09 DIAGNOSIS — M542 Cervicalgia: Secondary | ICD-10-CM | POA: Diagnosis not present

## 2021-11-09 DIAGNOSIS — M79605 Pain in left leg: Secondary | ICD-10-CM | POA: Diagnosis not present

## 2021-11-09 DIAGNOSIS — Z23 Encounter for immunization: Secondary | ICD-10-CM | POA: Diagnosis not present

## 2021-11-09 DIAGNOSIS — M5442 Lumbago with sciatica, left side: Secondary | ICD-10-CM | POA: Diagnosis not present

## 2021-11-09 DIAGNOSIS — G8929 Other chronic pain: Secondary | ICD-10-CM | POA: Diagnosis not present

## 2021-11-09 DIAGNOSIS — M5441 Lumbago with sciatica, right side: Secondary | ICD-10-CM | POA: Diagnosis not present

## 2021-11-09 DIAGNOSIS — G4733 Obstructive sleep apnea (adult) (pediatric): Secondary | ICD-10-CM | POA: Diagnosis not present

## 2021-11-10 DIAGNOSIS — M5441 Lumbago with sciatica, right side: Secondary | ICD-10-CM | POA: Diagnosis not present

## 2021-11-10 DIAGNOSIS — G8929 Other chronic pain: Secondary | ICD-10-CM | POA: Diagnosis not present

## 2021-11-10 DIAGNOSIS — M542 Cervicalgia: Secondary | ICD-10-CM | POA: Diagnosis not present

## 2021-11-10 DIAGNOSIS — G894 Chronic pain syndrome: Secondary | ICD-10-CM | POA: Diagnosis not present

## 2021-11-10 DIAGNOSIS — G4733 Obstructive sleep apnea (adult) (pediatric): Secondary | ICD-10-CM | POA: Diagnosis not present

## 2021-11-10 DIAGNOSIS — M5412 Radiculopathy, cervical region: Secondary | ICD-10-CM | POA: Diagnosis not present

## 2021-11-10 DIAGNOSIS — M5416 Radiculopathy, lumbar region: Secondary | ICD-10-CM | POA: Diagnosis not present

## 2021-11-10 DIAGNOSIS — F4321 Adjustment disorder with depressed mood: Secondary | ICD-10-CM | POA: Diagnosis not present

## 2021-11-10 DIAGNOSIS — M79604 Pain in right leg: Secondary | ICD-10-CM | POA: Diagnosis not present

## 2021-12-14 DIAGNOSIS — M5412 Radiculopathy, cervical region: Secondary | ICD-10-CM | POA: Diagnosis not present

## 2021-12-14 DIAGNOSIS — M542 Cervicalgia: Secondary | ICD-10-CM | POA: Diagnosis not present

## 2021-12-16 DIAGNOSIS — M5412 Radiculopathy, cervical region: Secondary | ICD-10-CM | POA: Diagnosis not present

## 2021-12-16 DIAGNOSIS — M542 Cervicalgia: Secondary | ICD-10-CM | POA: Diagnosis not present

## 2021-12-26 DIAGNOSIS — Z03818 Encounter for observation for suspected exposure to other biological agents ruled out: Secondary | ICD-10-CM | POA: Diagnosis not present

## 2021-12-26 DIAGNOSIS — J020 Streptococcal pharyngitis: Secondary | ICD-10-CM | POA: Diagnosis not present

## 2021-12-26 DIAGNOSIS — J029 Acute pharyngitis, unspecified: Secondary | ICD-10-CM | POA: Diagnosis not present

## 2021-12-29 DIAGNOSIS — M542 Cervicalgia: Secondary | ICD-10-CM | POA: Diagnosis not present

## 2021-12-29 DIAGNOSIS — M5412 Radiculopathy, cervical region: Secondary | ICD-10-CM | POA: Diagnosis not present

## 2022-01-15 DIAGNOSIS — J019 Acute sinusitis, unspecified: Secondary | ICD-10-CM | POA: Diagnosis not present

## 2022-01-15 DIAGNOSIS — J209 Acute bronchitis, unspecified: Secondary | ICD-10-CM | POA: Diagnosis not present

## 2022-01-15 DIAGNOSIS — Z03818 Encounter for observation for suspected exposure to other biological agents ruled out: Secondary | ICD-10-CM | POA: Diagnosis not present
